# Patient Record
Sex: Male | Born: 1985 | Race: Black or African American | Hispanic: No | Marital: Single | Smoking: Current every day smoker
Health system: Southern US, Community
[De-identification: ages and names within clinical notes are randomized; demographics above are authoritative.]

---
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
## 2011-08-05 ENCOUNTER — Emergency Department: Payer: Self-pay | Admitting: Internal Medicine

## 2011-08-08 ENCOUNTER — Emergency Department: Payer: Self-pay | Admitting: Unknown Physician Specialty

## 2011-08-26 ENCOUNTER — Ambulatory Visit: Payer: Self-pay | Admitting: Urology

## 2011-08-27 LAB — PATHOLOGY REPORT

## 2011-11-27 ENCOUNTER — Emergency Department: Payer: Self-pay | Admitting: Emergency Medicine

## 2013-02-27 IMAGING — CR DG KNEE COMPLETE 4+V*L*
1 series · 4 of 4 positions shown · non-contrast
Comparison: none

REASON FOR EXAM: pain
COMMENTS:   May transport without cardiac monitor

PROCEDURE:     DXR - DXR KNEE LT COMP WITH OBLIQUES  - November 27, 2011  [DATE]
RESULT:     Comparison:  None

[Series 2: t knee obl left · 0.14mm/px · 4 of 4 slices shown]
[im 1/4]
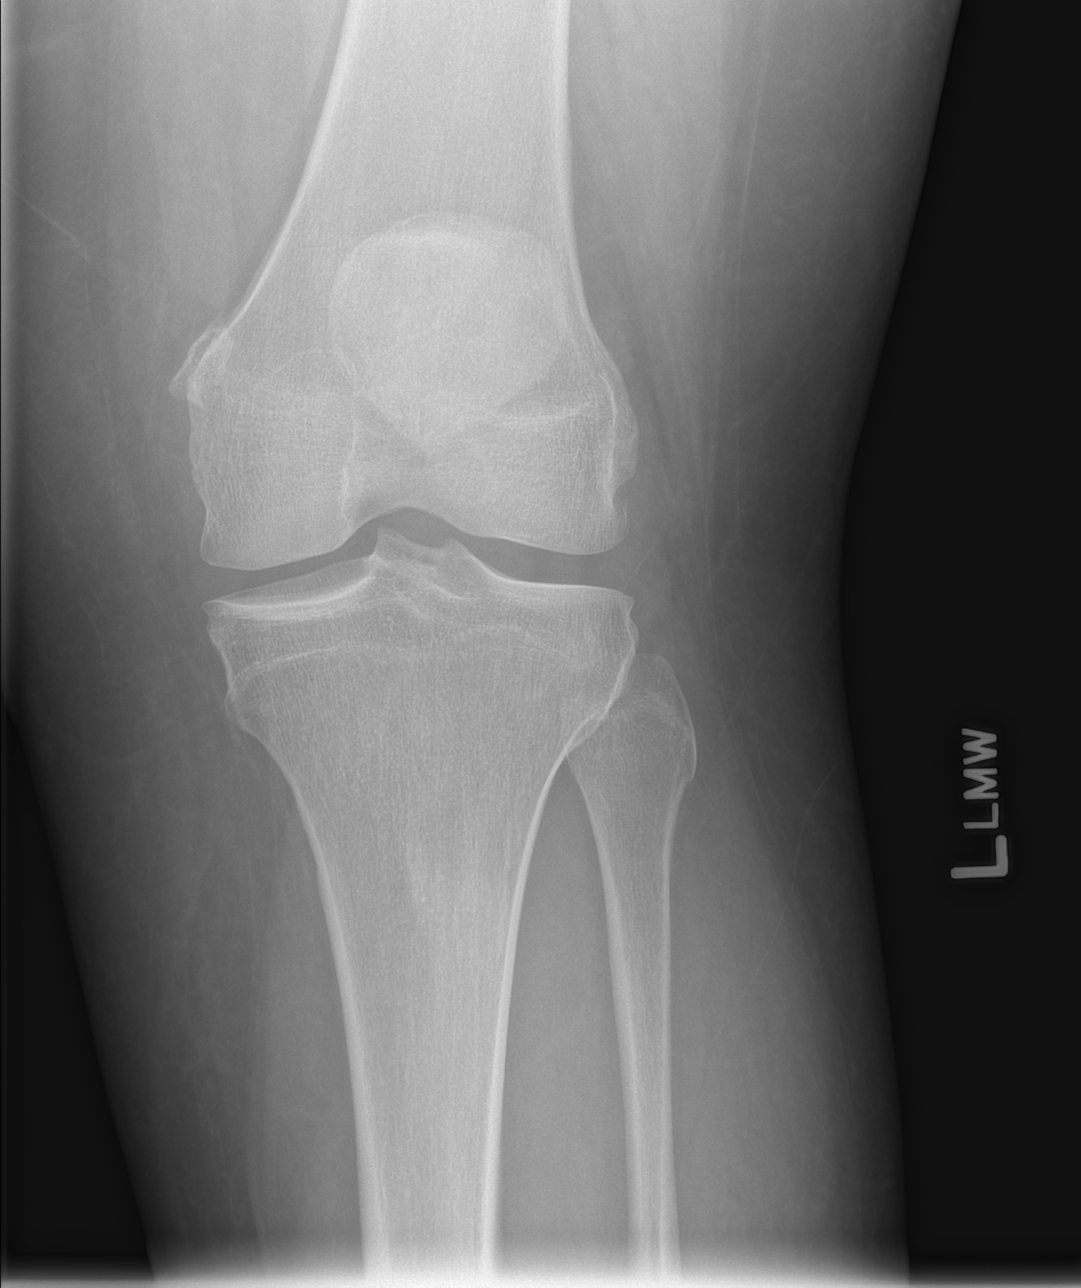
[im 2/4]
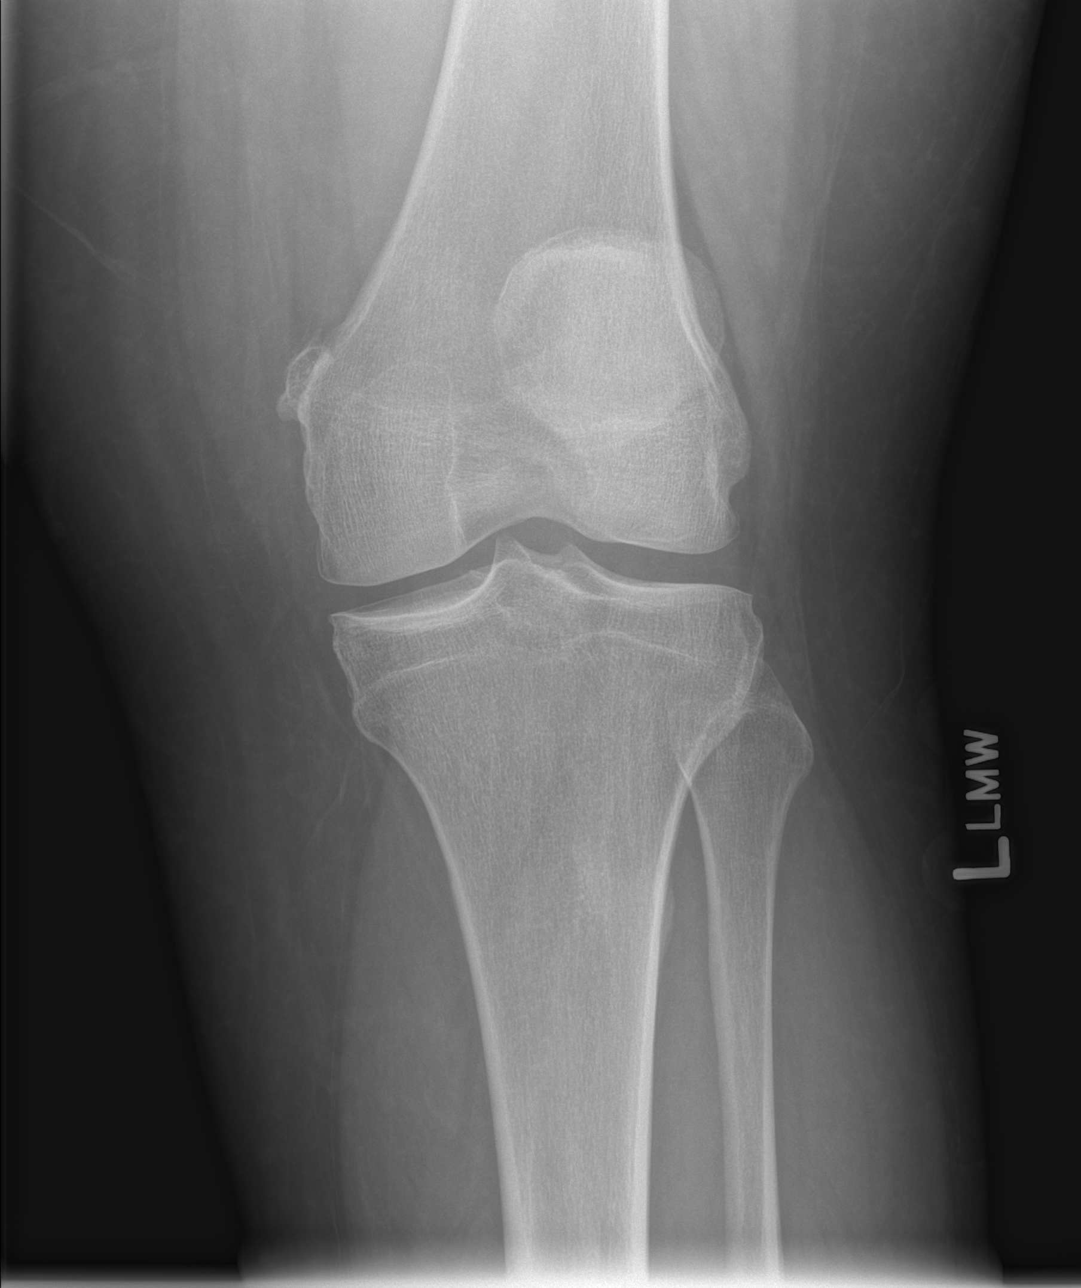
[im 3/4]
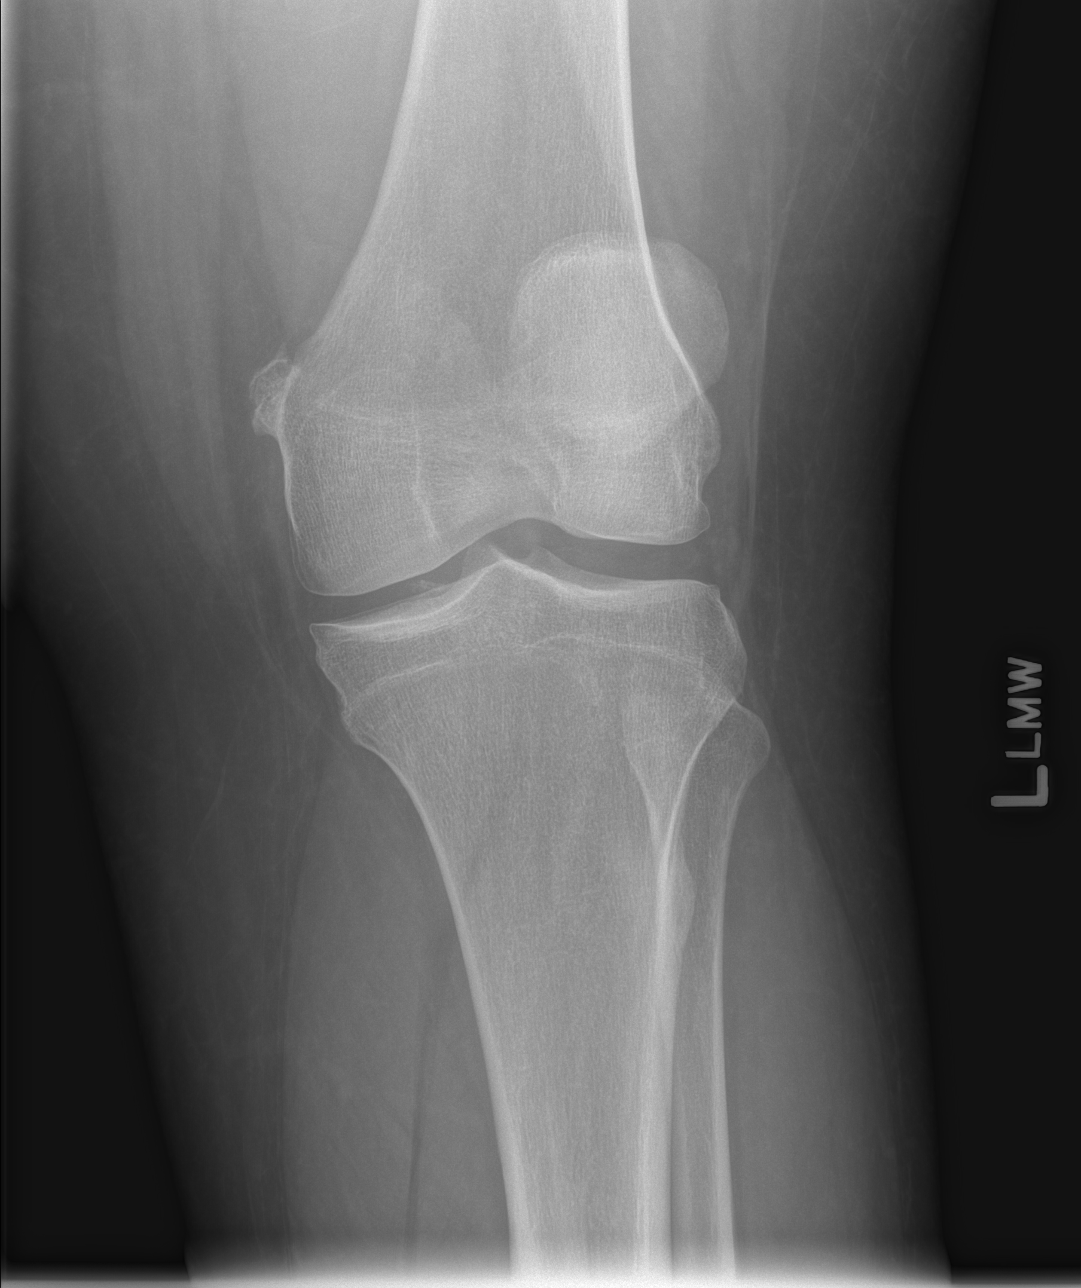
[im 4/4]
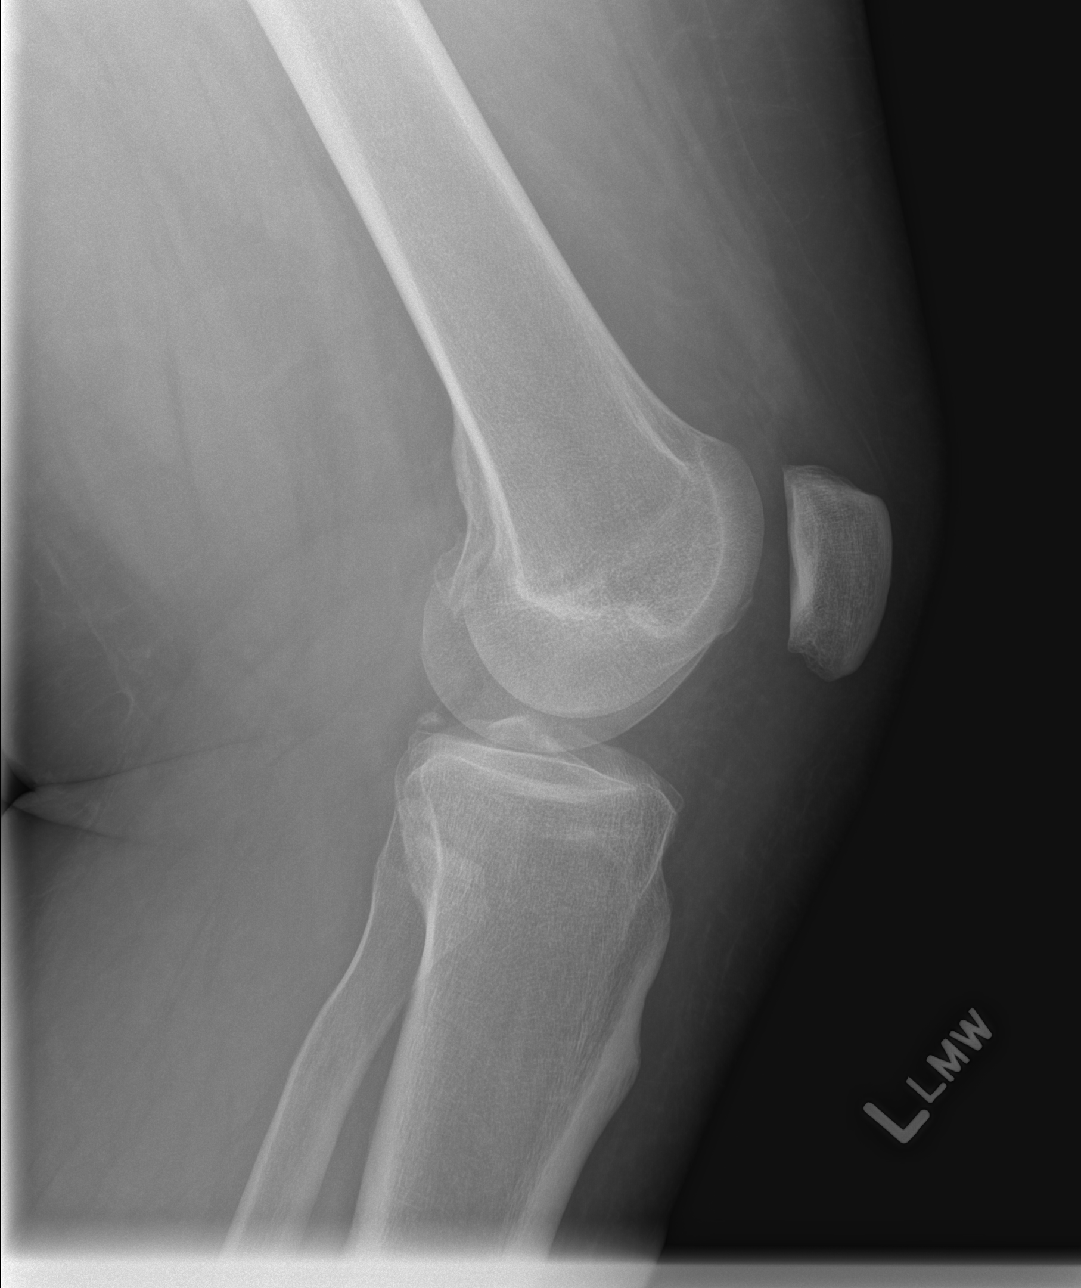

[4 of 4 positions shown; findings below may reference images not displayed]

FINDINGS: 4 views of the left knee demonstrates no acute fracture or dislocation.
There is no significant joint effusion. There is a small loose body in the
posterior medial joint.
IMPRESSION: No acute osseous injury of the left knee.

## 2014-11-16 ENCOUNTER — Emergency Department: Payer: Self-pay | Admitting: Emergency Medicine

## 2018-07-04 ENCOUNTER — Encounter: Payer: Self-pay | Admitting: Emergency Medicine

## 2018-07-04 ENCOUNTER — Other Ambulatory Visit: Payer: Self-pay

## 2018-07-04 ENCOUNTER — Emergency Department
Admission: EM | Admit: 2018-07-04 | Discharge: 2018-07-04 | Disposition: A | Discharge status: Home / Self Care | Payer: Self-pay | Attending: Emergency Medicine | Admitting: Emergency Medicine

## 2018-07-04 DIAGNOSIS — F17210 Nicotine dependence, cigarettes, uncomplicated: Secondary | ICD-10-CM | POA: Insufficient documentation

## 2018-07-04 DIAGNOSIS — K122 Cellulitis and abscess of mouth: Secondary | ICD-10-CM | POA: Insufficient documentation

## 2018-07-04 LAB — GROUP A STREP BY PCR: Group A Strep by PCR: NOT DETECTED

## 2018-07-04 MED ORDER — PREDNISONE 10 MG (21) PO TBPK
ORAL_TABLET | ORAL | 0 refills | Status: AC
Start: 2018-07-04 — End: ?

## 2018-07-04 NOTE — ED Triage Notes (Addendum)
Pt arrived via POV with reports of discomfort in throat when swallowing since yesterday. Pt is able to swallow saliva and liquids, but states he feels something in his throat.  Pt states he feels like his uvula is touching his tongue. Pt states he had a coughing fit earlier today and felt like mucus got hung up in his throat.   Pt denies any sore throat, difficulty breathing or shortness of breath.

## 2018-07-04 NOTE — ED Provider Notes (Signed)
[facility] Emergency Department Provider Note  ____________________________________________   First MD Initiated Contact with Patient 07/04/18 1105     (approximate)  I have reviewed the triage vital signs and the nursing notes.   HISTORY  Chief Complaint Throat Discomfort and Dysphagia    HPI Joshua Booker is a 32 y.o. male presents emergency department complaining of a sore throat where he feels like his uvula is touching his tongue.  He states he has had a mild amount of coughing.  But throat is mainly his problem.  He states he can swallow liquids.  He denies fever chills.  He is unsure if he has been exposed to strep.  He denies any chest pain, shortness of breath, vomiting or diarrhea    History reviewed. No pertinent past medical history.  There are no active problems to display for this patient.   History reviewed. No pertinent surgical history.  Prior to Admission medications   Medication Sig Start Date End Date Taking? Authorizing Provider  predniSONE (STERAPRED UNI-PAK 21 TAB) 10 MG (21) TBPK tablet Take 6 pills on day one then decrease by 1 pill each day 07/04/18   Faythe GheeFisher, Tyrion Glaude W, PA-C    Allergies Patient has no known allergies.  History reviewed. No pertinent family history.  Social History Social History   Tobacco Use  . Smoking status: Current Every Day Smoker    Packs/day: 0.25    Types: Cigarettes  . Smokeless tobacco: Never Used  Substance Use Topics  . Alcohol use: Not on file  . Drug use: Not on file    Review of Systems  Constitutional: No fever/chills Eyes: No visual changes. ENT: Positive sore throat. Respiratory: Denies cough Genitourinary: Negative for dysuria. Musculoskeletal: Negative for back pain. Skin: Negative for rash.    ____________________________________________   PHYSICAL EXAM:  VITAL SIGNS: ED Triage Vitals  Enc Vitals Group     BP 07/04/18 1047 (!) 156/90     Pulse Rate  07/04/18 1047 70     Resp 07/04/18 1047 18     Temp 07/04/18 1047 98.5 F (36.9 C)     Temp Source 07/04/18 1047 Oral     SpO2 --      Weight 07/04/18 1048 (!) 380 lb (172.4 kg)     Height 07/04/18 1048 6\' 2"  (1.88 m)     Head Circumference --      Peak Flow --      Pain Score --      Pain Loc --      Pain Edu? --      Excl. in GC? --     Constitutional: Alert and oriented. Well appearing and in no acute distress. Eyes: Conjunctivae are normal.  Head: Atraumatic. Ears: TMs clear bilaterally Nose: No congestion/rhinnorhea. Mouth/Throat: Mucous membranes are moist.  Throat is red enlarged tonsils, the uvula is a little enlarged and elongated Neck:  supple no lymphadenopathy noted Cardiovascular: Normal rate, regular rhythm. Heart sounds are normal Respiratory: Normal respiratory effort.  No retractions, lungs c t a  GU: deferred Musculoskeletal: FROM all extremities, warm and well perfused Neurologic:  Normal speech and language.  Skin:  Skin is warm, dry and intact. No rash noted. Psychiatric: Mood and affect are normal. Speech and behavior are normal.  ____________________________________________   LABS (all labs ordered are listed, but only abnormal results are displayed)  Labs Reviewed  GROUP A STREP BY PCR   ____________________________________________   ____________________________________________  RADIOLOGY  ____________________________________________   PROCEDURES  Procedure(s) performed: No  Procedures    ____________________________________________   INITIAL IMPRESSION / ASSESSMENT AND PLAN / ED COURSE  Pertinent labs & imaging results that were available during my care of the patient were reviewed by me and considered in my medical decision making (see chart for details).   Patient is a 32 year old male presents emergency department complaining of a sore throat and elongated uvula.  7 difficulty swallowing but is able to swallow  liquids.  Physical exam the patient's throat is bright red with swollen tonsils and a elongated uvula.  Remainder the exam is unremarkable  Explained the exam findings to the patient.  He still wants a strep test to know he knows he needs to wait 1 hour.  Strep test was ordered.   Strep is negative.  Test results were explained to the patient.  He was diagnosed with uvulitis.  Explained to him this is viral and that steroids would help decrease the amount of swelling.  He is to gargle warm salt water for relief.  Follow-up with his regular doctor if not better in 3 to 5 days.  Return to the emergency department if worsening.  He states he understands comply.  Was discharged in stable condition.  A prescription for Sterapred was sent to his pharmacy via E prescription  As part of my medical decision making, I reviewed the following data within the electronic MEDICAL RECORD NUMBER Nursing notes reviewed and incorporated, Labs reviewed strep test negative, Notes from prior ED visits and [place] Controlled Substance Database  ____________________________________________   FINAL CLINICAL IMPRESSION(S) / ED DIAGNOSES  Final diagnoses:  Uvulitis      NEW MEDICATIONS STARTED DURING THIS VISIT:  Discharge Medication List as of 07/04/2018 12:04 PM    START taking these medications   Details  predniSONE (STERAPRED UNI-PAK 21 TAB) 10 MG (21) TBPK tablet Take 6 pills on day one then decrease by 1 pill each day, Normal         Note:  This document was prepared using Dragon voice recognition software and may include unintentional dictation errors.    Faythe Ghee, PA-C 07/04/18 1348    Jene Every, MD 07/04/18 [phone]

## 2018-07-04 NOTE — Discharge Instructions (Addendum)
Follow-up with your regular doctor return to emergency department if worsening.  Take the medication as prescribed.  Gargle with warm salt water as this is a natural antiseptic and will help with healing.  Take Tylenol as needed for pain.  Return as needed.

## 2018-07-04 NOTE — ED Notes (Signed)
First Nurse Note: Patient states he woke up this morning and felt like he was choking, denies sore throat or SHOB.  Speech is clear.

## 2019-05-09 ENCOUNTER — Other Ambulatory Visit: Payer: Self-pay

## 2019-05-09 ENCOUNTER — Encounter: Payer: Self-pay | Admitting: *Deleted

## 2019-05-09 DIAGNOSIS — F410 Panic disorder [episodic paroxysmal anxiety] without agoraphobia: Secondary | ICD-10-CM | POA: Insufficient documentation

## 2019-05-09 DIAGNOSIS — F17210 Nicotine dependence, cigarettes, uncomplicated: Secondary | ICD-10-CM | POA: Insufficient documentation

## 2019-05-09 LAB — CBC
HCT: 41 % (ref 39.0–52.0)
Hemoglobin: 13.7 g/dL (ref 13.0–17.0)
MCH: 27.1 pg (ref 26.0–34.0)
MCHC: 33.4 g/dL (ref 30.0–36.0)
MCV: 81 fL (ref 80.0–100.0)
Platelets: 308 10*3/uL (ref 150–400)
RBC: 5.06 MIL/uL (ref 4.22–5.81)
RDW: 12.8 % (ref 11.5–15.5)
WBC: 12.4 10*3/uL — ABNORMAL HIGH (ref 4.0–10.5)
nRBC: 0 % (ref 0.0–0.2)

## 2019-05-09 NOTE — ED Triage Notes (Signed)
Pt reports feeling anxious tonight while driving.  Pt had not heard from girlfriend tonight and started having palpitations. Pt reports feeling better now.  No chest pain at this time. Pt alert  Speech clear.  Ambulates without diff.

## 2019-05-10 ENCOUNTER — Emergency Department
Admission: EM | Admit: 2019-05-10 | Discharge: 2019-05-10 | Disposition: A | Discharge status: Home / Self Care | Payer: Self-pay | Attending: Emergency Medicine | Admitting: Emergency Medicine

## 2019-05-10 DIAGNOSIS — F410 Panic disorder [episodic paroxysmal anxiety] without agoraphobia: Secondary | ICD-10-CM

## 2019-05-10 LAB — BASIC METABOLIC PANEL
Anion gap: 11 (ref 5–15)
BUN: 12 mg/dL (ref 6–20)
CO2: 19 mmol/L — ABNORMAL LOW (ref 22–32)
Calcium: 8.7 mg/dL — ABNORMAL LOW (ref 8.9–10.3)
Chloride: 109 mmol/L (ref 98–111)
Creatinine, Ser: 0.86 mg/dL (ref 0.61–1.24)
GFR calc Af Amer: >60 mL/min (ref >60)
GFR calc non Af Amer: >60 mL/min (ref >60)
Glucose, Bld: 108 mg/dL — ABNORMAL HIGH (ref 70–99)
Potassium: 3.6 mmol/L (ref 3.5–5.1)
Sodium: 139 mmol/L (ref 135–145)

## 2019-05-10 LAB — TROPONIN I: Troponin I: <0.03 ng/mL (ref <0.03)

## 2019-05-10 NOTE — ED Provider Notes (Signed)
[facility] Emergency Department Provider Note    First MD Initiated Contact with Patient 05/10/19 [phone]     (approximate)  I have reviewed the triage vital signs and the nursing notes.   HISTORY  Chief Complaint Anxiety    HPI Joshua Booker is a 33 y.o. male presents to the emergency department with feelings of anxiety while driving tonight.  Patient states that he had not heard from his girlfriend and was markedly concerned about her wellbeing.  Patient states that he felt very anxious and as a result started focusing on his breathing.  Patient states that symptoms improved with deep breathing.  Patient denied any chest pain no shortness of breath or dizziness.  Patient states that all symptoms have resolved and that he is spoken to his girlfriend which improved his symptoms considerably.       No past medical history on file.  There are no active problems to display for this patient.   No past surgical history on file.  Prior to Admission medications   Medication Sig Start Date End Date Taking? Authorizing Provider  predniSONE (STERAPRED UNI-PAK 21 TAB) 10 MG (21) TBPK tablet Take 6 pills on day one then decrease by 1 pill each day 07/04/18   Faythe GheeFisher, Susan W, PA-C    Allergies Patient has no known allergies.  No family history on file.  Social History Social History   Tobacco Use  . Smoking status: Current Every Day Smoker    Packs/day: 0.25    Types: Cigarettes  . Smokeless tobacco: Never Used  Substance Use Topics  . Alcohol use: Yes  . Drug use: Not Currently    Review of Systems Constitutional: No fever/chills Eyes: No visual changes. ENT: No sore throat. Cardiovascular: Denies chest pain. Respiratory: Denies shortness of breath. Gastrointestinal: No abdominal pain.  No nausea, no vomiting.  No diarrhea.  No constipation. Genitourinary: Negative for dysuria. Musculoskeletal: Negative for neck pain.  Negative for back pain.  Integumentary: Negative for rash. Neurological: Negative for headaches, focal weakness or numbness.  ____________________________________________   PHYSICAL EXAM:  VITAL SIGNS: ED Triage Vitals [05/09/19 2328]  Enc Vitals Group     BP (!) 138/106     Pulse Rate 88     Resp 18     Temp 99.2 F (37.3 C)     Temp Source Oral     SpO2 99 %     Weight (!) 158.8 kg (350 lb)     Height 1.88 m (6\' 2" )     Head Circumference      Peak Flow      Pain Score 5     Pain Loc      Pain Edu?      Excl. in GC?     Constitutional: Alert and oriented. Well appearing and in no acute distress. Eyes: Conjunctivae are normal.  Mouth/Throat: Mucous membranes are moist. Oropharynx non-erythematous. Neck: No stridor.   Cardiovascular: Normal rate, regular rhythm. Good peripheral circulation. Grossly normal heart sounds. Respiratory: Normal respiratory effort.  No retractions. No audible wheezing. Gastrointestinal: Soft and nontender. No distention.  Musculoskeletal: No lower extremity tenderness nor edema. No gross deformities of extremities. Neurologic:  Normal speech and language. No gross focal neurologic deficits are appreciated.  Skin:  Skin is warm, dry and intact. No rash noted. Psychiatric: Mood and affect are normal. Speech and behavior are normal.  ____________________________________________   LABS (all labs ordered are listed, but only abnormal results are  displayed)  Labs Reviewed  BASIC METABOLIC PANEL - Abnormal; Notable for the following components:      Result Value   CO2 19 (*)    Glucose, Bld 108 (*)    Calcium 8.7 (*)    All other components within normal limits  CBC - Abnormal; Notable for the following components:   WBC 12.4 (*)    All other components within normal limits  TROPONIN I   ____________________________________________  EKG  ED ECG REPORT I, Caberfae N Keoni Havey, the attending physician, personally viewed and interpreted this ECG.   Date:  05/10/2019  EKG Time: 11:36 PM  Rate: 89  Rhythm: Normal sinus rhythm  Axis: Normal  Intervals: Normal  ST&T Change: None   Procedures   ____________________________________________   INITIAL IMPRESSION / MDM / ASSESSMENT AND PLAN / ED COURSE  As part of my medical decision making, I reviewed the following data within the electronic MEDICAL RECORD NUMBER  33 year old male presenting with above-stated history and physical exam secondary to reported feelings of anxiety that is since resolved.  Laboratory data EKG was performed before my evaluation which was all unremarkable.  *Asir Bingley Ninneman was evaluated in Emergency Department on 05/10/2019 for the symptoms described in the history of present illness. He was evaluated in the context of the global COVID-19 pandemic, which necessitated consideration that the patient might be at risk for infection with the SARS-CoV-2 virus that causes COVID-19. Institutional protocols and algorithms that pertain to the evaluation of patients at risk for COVID-19 are in a state of rapid change based on information released by regulatory bodies including the CDC and federal and state organizations. These policies and algorithms were followed during the patient's care in the ED.  Some ED evaluations and interventions may be delayed as a result of limited staffing during the pandemic.*  __________________________________  FINAL CLINICAL IMPRESSION(S) / ED DIAGNOSES  Final diagnoses:  Anxiety attack     MEDICATIONS GIVEN DURING THIS VISIT:  Medications - No data to display   ED Discharge Orders    None       Note:  This document was prepared using Dragon voice recognition software and may include unintentional dictation errors.   Gregor Hams, MD 05/10/19 [phone]

## 2022-11-18 ENCOUNTER — Other Ambulatory Visit: Payer: Self-pay

## 2022-11-18 ENCOUNTER — Emergency Department: Payer: Self-pay

## 2022-11-18 ENCOUNTER — Emergency Department
Admission: EM | Admit: 2022-11-18 | Discharge: 2022-11-19 | Disposition: A | Discharge status: Home / Self Care | Payer: Self-pay | Attending: Emergency Medicine | Admitting: Emergency Medicine

## 2022-11-18 DIAGNOSIS — M25572 Pain in left ankle and joints of left foot: Secondary | ICD-10-CM | POA: Insufficient documentation

## 2022-11-18 MED ORDER — MELOXICAM 15 MG PO TABS: 15.0000 mg | ORAL_TABLET | Freq: Every day | ORAL | 1 refills | Status: AC

## 2022-11-18 NOTE — ED Provider Notes (Signed)
[facility] Provider Note  Patient Contact: 11:20 PM (approximate)   History   Ankle Pain   HPI  Joshua Booker is a 36 y.o. male presents to the emergency department with left ankle pain.  Patient denies falls or specific mechanism of trauma.  He states that he has to stand for upwards of 8 hours a day and has some swelling.  He states that swelling improves with compression stockings but seems to worsen if he removes his stockings.  He denies chest pain, chest tightness or shortness of breath.  He did recently change shoes.      Physical Exam   Triage Vital Signs: ED Triage Vitals  Enc Vitals Group     BP 11/18/22 1829 (!) 156/80     Pulse Rate 11/18/22 1829 71     Resp --      Temp 11/18/22 1828 98.2 F (36.8 C)     Temp Source 11/18/22 1828 Oral     SpO2 11/18/22 1829 98 %     Weight --      Height --      Head Circumference --      Peak Flow --      Pain Score 11/18/22 1828 4     Pain Loc --      Pain Edu? --      Excl. in GC? --     Most recent vital signs: Vitals:   11/18/22 1828 11/18/22 1829  BP:  (!) 156/80  Pulse:  71  Temp: 98.2 F (36.8 C)   SpO2:  98%     General: Alert and in no acute distress. Eyes:  PERRL. EOMI. Head: No acute traumatic findings ENT:      Nose: No congestion/rhinnorhea.      Mouth/Throat: Mucous membranes are moist. Neck: No stridor. No cervical spine tenderness to palpation. Cardiovascular:  Good peripheral perfusion Respiratory: Normal respiratory effort without tachypnea or retractions. Lungs CTAB. Good air entry to the bases with no decreased or absent breath sounds. Gastrointestinal: Bowel sounds 4 quadrants. Soft and nontender to palpation. No guarding or rigidity. No palpable masses. No distention. No CVA tenderness. Musculoskeletal: Full range of motion to all extremities.  No pitting edema of the left ankle.  Patient can move all 5 left toes.  Palpable dorsalis pedis pulse,  left. Neurologic:  No gross focal neurologic deficits are appreciated.  Skin:   No rash noted Other:   ED Results / Procedures / Treatments   Labs (all labs ordered are listed, but only abnormal results are displayed) Labs Reviewed - No data to display      RADIOLOGY  I personally viewed and evaluated these images as part of my medical decision making, as well as reviewing the written report by the radiologist.  ED Provider Interpretation: Ankle soft tissue swelling without acute bony abnormality on x-ray   PROCEDURES:  Critical Care performed: No  Procedures   MEDICATIONS ORDERED IN ED: Medications - No data to display   IMPRESSION / MDM / ASSESSMENT AND PLAN / ED COURSE  I reviewed the triage vital signs and the nursing notes.                              Assessment and plan Ankle pain 36 year old male presents to the emergency department with acute left ankle pain.  No acute bony abnormality on x-ray.  Recommended rest, ice, compression elevation.  Patient was discharged with meloxicam and a work note was provided.      FINAL CLINICAL IMPRESSION(S) / ED DIAGNOSES   Final diagnoses:  Acute left ankle pain     Rx / DC Orders   ED Discharge Orders          Ordered    meloxicam (MOBIC) 15 MG tablet  Daily        11/18/22 2306             Note:  This document was prepared using Dragon voice recognition software and may include unintentional dictation errors.   Pia Mau [place], PA-C 11/18/22 Janetta Hora    Shaune Pollack, MD 11/22/22 [phone]

## 2022-11-18 NOTE — Discharge Instructions (Addendum)
Apply ice to left ankle. Take daily Meloxicam.

## 2022-11-18 NOTE — ED Triage Notes (Signed)
Pt c/o left ankle pain x 1 week. Pt states that he has not injured it that he is aware of. Pt states that he stands in the same spot on 8 hours and when he wakes up the next morning he has bad pain.

## 2023-01-06 ENCOUNTER — Encounter: Payer: Self-pay | Admitting: Podiatry

## 2023-01-06 ENCOUNTER — Ambulatory Visit (INDEPENDENT_AMBULATORY_CARE_PROVIDER_SITE_OTHER): Payer: BC Managed Care – PPO | Admitting: Podiatry

## 2023-01-06 ENCOUNTER — Ambulatory Visit (INDEPENDENT_AMBULATORY_CARE_PROVIDER_SITE_OTHER): Payer: BC Managed Care – PPO

## 2023-01-06 DIAGNOSIS — M25572 Pain in left ankle and joints of left foot: Secondary | ICD-10-CM

## 2023-01-06 DIAGNOSIS — M76822 Posterior tibial tendinitis, left leg: Secondary | ICD-10-CM

## 2023-01-06 MED ORDER — TRIAMCINOLONE ACETONIDE 10 MG/ML IJ SUSP
10.0000 mg | Freq: Once | INTRAMUSCULAR | Status: AC
  Administered 2023-01-06: 10 mg

## 2023-01-06 MED ORDER — DICLOFENAC SODIUM 75 MG PO TBEC: 75.0000 mg | DELAYED_RELEASE_TABLET | Freq: Two times a day (BID) | ORAL | 2 refills | Status: AC

## 2023-01-07 ENCOUNTER — Other Ambulatory Visit: Payer: Self-pay | Admitting: Podiatry

## 2023-01-07 DIAGNOSIS — M76822 Posterior tibial tendinitis, left leg: Secondary | ICD-10-CM

## 2023-01-07 DIAGNOSIS — M25572 Pain in left ankle and joints of left foot: Secondary | ICD-10-CM

## 2023-01-07 NOTE — Progress Notes (Signed)
Subjective:   Patient ID: Joshua Booker, male   DOB: 37 y.o.   MRN: [id number]   HPI Patient states he stands a lot at work and has developed significant pain in his left ankle with obesity is complicating factor.  He has taken meloxicam and help some slightly but he needs to wear steel toe shoes and also smokes quarter pack of cigarettes a day and would like to lose weight in the future.  Patient is not active   Review of Systems  All other systems reviewed and are negative.       Objective:  Physical Exam Vitals and nursing note reviewed.  Constitutional:      Appearance: He is well-developed.  Pulmonary:     Effort: Pulmonary effort is normal.  Musculoskeletal:        General: Normal range of motion.  Skin:    General: Skin is warm.  Neurological:     Mental Status: He is alert.     Neurovascular status was found to be intact muscle strength is adequate range of motion moderately reduced subtalar midtarsal joint bilateral.  Inflammation fluid buildup around the medial ankle in the area posterior tibial tendon no indications of tendon dysfunction currently with weight as a certain complicating factor along with the fact he is working long hours weightbearing concrete surfaces with obesity.  Good digital perfusion well-oriented     Assessment:  Chronic posterior tibial tendinitis left with inflammation fluid with obesity is complicating factor flatfoot deformity is complicating factor     Plan:  H&P x-rays reviewed discussed weight loss and the importance of this for him sterile prep injected the medial tendon 3 mg Dexasone Kenalog 5 g Xylocaine after explaining risk applied fascial brace to lift up the arch we will replace the Mobic with diclofenac 75 mg twice daily and applied good support shoes to him.  May require orthotics reappoint 2 weeks  X-rays do indicate depression of the arch left indications of a lot of stress on the subtalar joint with spurring of the talus

## 2023-01-20 ENCOUNTER — Encounter: Payer: Self-pay | Admitting: Podiatry

## 2023-01-20 ENCOUNTER — Ambulatory Visit (INDEPENDENT_AMBULATORY_CARE_PROVIDER_SITE_OTHER): Payer: BC Managed Care – PPO | Admitting: Podiatry

## 2023-01-20 DIAGNOSIS — M779 Enthesopathy, unspecified: Secondary | ICD-10-CM | POA: Diagnosis not present

## 2023-01-20 DIAGNOSIS — M76822 Posterior tibial tendinitis, left leg: Secondary | ICD-10-CM

## 2023-01-20 NOTE — Progress Notes (Signed)
Subjective:   Patient ID: Joshua Booker, male   DOB: 37 y.o.   MRN: [id number]   HPI Patient presents stating that he is feeling quite a bit better but he has pain in a somewhat different area and states also that he knows he has flatfeet and obesity   ROS      Objective:  Physical Exam  Ocular status intact with inflammation that is more on the dorsal extensor complex versus ankle and posterior tibial with flatfoot deformity and chronic inflammation left foot     Assessment:  Flatfoot deformity tendinitis obesity with working on hard cement surfaces complicating factor     Plan:  Explained all conditions did do sterile prep and injected the anterior tib complex and into the extensor complex 3 mg dexamethasone Kenalog 5 mg Xylocaine at that casted for functional orthotics to lift the arches.  Patient will be seen back when those are returned may require further treatment encourage weight loss

## 2023-03-10 ENCOUNTER — Ambulatory Visit (INDEPENDENT_AMBULATORY_CARE_PROVIDER_SITE_OTHER): Payer: BC Managed Care – PPO | Admitting: Podiatry

## 2023-03-10 ENCOUNTER — Telehealth: Payer: Self-pay | Admitting: Podiatry

## 2023-03-10 ENCOUNTER — Encounter: Payer: Self-pay | Admitting: Podiatry

## 2023-03-10 DIAGNOSIS — M76822 Posterior tibial tendinitis, left leg: Secondary | ICD-10-CM

## 2023-03-10 DIAGNOSIS — M25572 Pain in left ankle and joints of left foot: Secondary | ICD-10-CM

## 2023-03-10 NOTE — Progress Notes (Signed)
Patient presents today to pick up custom molded foot orthotics recommended by Dr. REGAL.   Orthotics were dispensed and fit was satisfactory. Reviewed instructions for break-in and wear. Written instructions given to patient.  Patient will follow up as needed.
